# Patient Record
Sex: Male | Born: 2000
Health system: Southern US, Community
[De-identification: ages and names within clinical notes are randomized; demographics above are authoritative.]

## PROBLEM LIST (undated history)

## (undated) DIAGNOSIS — F909 Attention-deficit hyperactivity disorder, unspecified type: Secondary | ICD-10-CM

## (undated) HISTORY — DX: Attention-deficit hyperactivity disorder, unspecified type: F90.9

---
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
## 2019-12-11 ENCOUNTER — Other Ambulatory Visit: Payer: Self-pay | Admitting: Sports Medicine

## 2019-12-11 ENCOUNTER — Other Ambulatory Visit: Payer: Self-pay

## 2019-12-11 ENCOUNTER — Encounter: Payer: Self-pay | Admitting: Sports Medicine

## 2019-12-11 ENCOUNTER — Ambulatory Visit (INDEPENDENT_AMBULATORY_CARE_PROVIDER_SITE_OTHER): Payer: No Typology Code available for payment source | Admitting: Sports Medicine

## 2019-12-11 DIAGNOSIS — F32A Depression, unspecified: Secondary | ICD-10-CM

## 2019-12-11 DIAGNOSIS — F419 Anxiety disorder, unspecified: Secondary | ICD-10-CM | POA: Diagnosis not present

## 2019-12-11 MED ORDER — ESCITALOPRAM OXALATE 10 MG PO TABS: 10.0000 mg | ORAL_TABLET | Freq: Every day | ORAL | 3 refills | Status: DC

## 2019-12-11 MED FILL — ESCITALOPRAM 10 MG TABLET: 10 | 30 days supply | Qty: 30 | Fill #0

## 2019-12-11 NOTE — Progress Notes (Signed)
    Procedures performed today:    None.  Independent interpretation of notes and tests performed by another provider:   None.  Brief History, Exam, Impression, and Recommendations:    Anxiety and depression 19 year old male, historically approximately 2 years ago we treated him for anxiety and depression with Cymbalta, he really did not follow-up after this. Unfortunately he has noted worsening symptoms including severe agoraphobia. Difficulty concentrating, and concerns about attention deficit disorder. Today we discussed how difficulty concentrating can actually be related to anxiety and depression, we discussed the monoamine hypothesis of how SSRIs work. He does have a friend taking Lexapro and is agreeable to try this. I would also like to add some behavioral therapy for He does understand that the medication can take 4 to 6 weeks to start working. Starting Lexapro 10, and behavioral therapy, return to see me in 6 weeks, repeat PHQ at that time. No suicidal/homicidal ideation.    ___________________________________________ Ihor Austin. Benjamin Stain, M.D., ABFM., CAQSM. Primary Care and Sports Medicine [facility]  Adjunct Instructor of Family Medicine  [employer]

## 2019-12-11 NOTE — Assessment & Plan Note (Signed)
19 year old male, historically approximately 2 years ago we treated him for anxiety and depression with Cymbalta, he really did not follow-up after this. Unfortunately he has noted worsening symptoms including severe agoraphobia. Difficulty concentrating, and concerns about attention deficit disorder. Today we discussed how difficulty concentrating can actually be related to anxiety and depression, we discussed the monoamine hypothesis of how SSRIs work. He does have a friend taking Lexapro and is agreeable to try this. I would also like to add some behavioral therapy for He does understand that the medication can take 4 to 6 weeks to start working. Starting Lexapro 10, and behavioral therapy, return to see me in 6 weeks, repeat PHQ at that time. No suicidal/homicidal ideation.

## 2020-01-12 MED FILL — ESCITALOPRAM 10 MG TABLET: 10 | 30 days supply | Qty: 30 | Fill #1

## 2020-01-22 ENCOUNTER — Telehealth: Payer: No Typology Code available for payment source | Admitting: Sports Medicine

## 2020-02-11 MED FILL — ESCITALOPRAM 10 MG TABLET: 10 | 30 days supply | Qty: 30 | Fill #2

## 2020-03-29 MED FILL — ESCITALOPRAM 10 MG TABLET: 10 | 30 days supply | Qty: 30 | Fill #3

## 2020-05-14 ENCOUNTER — Other Ambulatory Visit: Payer: Self-pay | Admitting: Sports Medicine

## 2020-05-14 DIAGNOSIS — F419 Anxiety disorder, unspecified: Secondary | ICD-10-CM

## 2020-05-14 MED FILL — ESCITALOPRAM 10 MG TABLET: 10 | 30 days supply | Qty: 30 | Fill #0

## 2020-06-11 ENCOUNTER — Other Ambulatory Visit (HOSPITAL_BASED_OUTPATIENT_CLINIC_OR_DEPARTMENT_OTHER): Payer: Self-pay

## 2020-06-11 MED FILL — Escitalopram Oxalate Tab 10 MG (Base Equiv): ORAL | 30 days supply | Qty: 30 | Fill #0 | Status: AC

## 2021-05-20 ENCOUNTER — Other Ambulatory Visit (HOSPITAL_COMMUNITY): Payer: Self-pay

## 2022-02-20 ENCOUNTER — Other Ambulatory Visit (HOSPITAL_BASED_OUTPATIENT_CLINIC_OR_DEPARTMENT_OTHER): Payer: Self-pay

## 2022-10-17 DIAGNOSIS — H5213 Myopia, bilateral: Secondary | ICD-10-CM | POA: Diagnosis not present

## 2023-02-23 ENCOUNTER — Encounter: Payer: Self-pay | Admitting: Sports Medicine

## 2023-02-23 ENCOUNTER — Ambulatory Visit (INDEPENDENT_AMBULATORY_CARE_PROVIDER_SITE_OTHER): Payer: Commercial Managed Care - PPO | Admitting: Sports Medicine

## 2023-02-23 VITALS — BP 147/82 | Ht 68.0 in | Wt 201.0 lb

## 2023-02-23 DIAGNOSIS — F32A Depression, unspecified: Secondary | ICD-10-CM

## 2023-02-23 DIAGNOSIS — Z23 Encounter for immunization: Secondary | ICD-10-CM | POA: Diagnosis not present

## 2023-02-23 DIAGNOSIS — Z0000 Encounter for general adult medical examination without abnormal findings: Secondary | ICD-10-CM

## 2023-02-23 DIAGNOSIS — F419 Anxiety disorder, unspecified: Secondary | ICD-10-CM

## 2023-02-23 MED ORDER — ESCITALOPRAM OXALATE 10 MG PO TABS: ORAL_TABLET | ORAL | 3 refills | Status: DC

## 2023-02-23 NOTE — Assessment & Plan Note (Signed)
 Flu and Tdap today.

## 2023-02-23 NOTE — Addendum Note (Signed)
 Addended by: Carren Rang A on: 02/23/2023 02:52 PM   Modules accepted: Orders

## 2023-02-23 NOTE — Progress Notes (Signed)
    Procedures performed today:    None.  Independent interpretation of notes and tests performed by another provider:   None.  Brief History, Exam, Impression, and Recommendations:    Anxiety and depression This is a very pleasant 23 year old male, I last saw him about 3 to 4 years ago, he was treated with anxiety and depression with Cymbalta  but really did not follow up after that. Ultimately we ended up switching him to Lexapro  and this seems to work extremely well, we will restart Lexapro , 10 mg. I would like to see him back in about 6 weeks. He does have some concern that he may be on the autism spectrum, if unable to get full efficacy with Lexapro  we can consider the addition of ASD specific behavioral therapy.  Annual physical exam Flu and Tdap today.    ____________________________________________ Debby PARAS. Curtis, M.D., ABFM., CAQSM., AME. Primary Care and Sports Medicine [facility]  Adjunct Professor of [facility] Medicine  [employer]  Restaurant Manager, [employer]

## 2023-02-23 NOTE — Assessment & Plan Note (Signed)
 This is a very pleasant 23 year old male, I last saw him about 3 to 4 years ago, he was treated with anxiety and depression with Cymbalta  but really did not follow up after that. Ultimately we ended up switching him to Lexapro  and this seems to work extremely well, we will restart Lexapro , 10 mg. I would like to see him back in about 6 weeks. He does have some concern that he may be on the autism spectrum, if unable to get full efficacy with Lexapro  we can consider the addition of ASD specific behavioral therapy.

## 2023-04-06 ENCOUNTER — Ambulatory Visit: Payer: Commercial Managed Care - PPO | Admitting: Sports Medicine

## 2023-04-06 DIAGNOSIS — L03031 Cellulitis of right toe: Secondary | ICD-10-CM | POA: Diagnosis not present

## 2023-04-06 DIAGNOSIS — F419 Anxiety disorder, unspecified: Secondary | ICD-10-CM | POA: Diagnosis not present

## 2023-04-06 DIAGNOSIS — F32A Depression, unspecified: Secondary | ICD-10-CM

## 2023-04-06 MED ORDER — ESCITALOPRAM OXALATE 10 MG PO TABS: 10.0000 mg | ORAL_TABLET | Freq: Every day | ORAL | 3 refills | Status: DC

## 2023-04-06 MED ORDER — DOXYCYCLINE HYCLATE 100 MG PO TABS: 100.0000 mg | ORAL_TABLET | Freq: Two times a day (BID) | ORAL | 0 refills | Status: AC

## 2023-04-06 NOTE — Progress Notes (Signed)
    Procedures performed today:    None.  Independent interpretation of notes and tests performed by another provider:   None.  Brief History, Exam, Impression, and Recommendations:    Paronychia of great toe, right Pain and swelling right great toe medial and lateral. On exam it does look like medial lateral paronychia with a bit of hyponychia as well. We will add doxycycline for 7 days, I like to see him back after about 2 weeks for medial and lateral nail plate excision with phenol matricectomy.  Anxiety and depression Dramatic improvements with Lexapro, we will continue this.    ____________________________________________ Ihor Austin. Benjamin Stain, M.D., ABFM., CAQSM., AME. Primary Care and Sports Medicine [place] [facility]  Adjunct Professor of Family Medicine  [employer] [employer]  Restaurant manager, fast food

## 2023-04-06 NOTE — Assessment & Plan Note (Signed)
 Dramatic improvements with Lexapro, we will continue this.

## 2023-04-06 NOTE — Assessment & Plan Note (Signed)
 Pain and swelling right great toe medial and lateral. On exam it does look like medial lateral paronychia with a bit of hyponychia as well. We will add doxycycline for 7 days, I like to see him back after about 2 weeks for medial and lateral nail plate excision with phenol matricectomy.

## 2023-04-20 ENCOUNTER — Ambulatory Visit: Payer: Commercial Managed Care - PPO | Admitting: Sports Medicine

## 2023-04-20 ENCOUNTER — Other Ambulatory Visit (HOSPITAL_BASED_OUTPATIENT_CLINIC_OR_DEPARTMENT_OTHER): Payer: Self-pay

## 2023-04-20 ENCOUNTER — Encounter: Payer: Self-pay | Admitting: Sports Medicine

## 2023-04-20 DIAGNOSIS — L03031 Cellulitis of right toe: Secondary | ICD-10-CM

## 2023-04-20 MED ORDER — HYDROCODONE-ACETAMINOPHEN 10-325 MG PO TABS
1.0000 | ORAL_TABLET | Freq: Three times a day (TID) | ORAL | 0 refills | Status: DC | PRN
  Filled 2023-04-20: qty 15, 5d supply, fill #0

## 2023-04-20 NOTE — Progress Notes (Signed)
    Procedures performed today:    Procedure:  Removal of right great medial and lateral nail plate. Risks, benefits, alternatives explained to patient. Consent obtained. Time out conducted. Noted no overlying induration or erythema at site of injection. Toe cleaned with alcohol, then a total of 10cc lidocaine 1% infiltrated at adjacent webspaces at the location of the bifurcation of the common digital nerve to proper digital nerves.  Some lidocaine also infiltrated at hyponychium and under nail bed.  Adequate anesthesia ensured. Toe prepped and draped in a sterile fashion. Nail elevator used to separate nail plate from nail bed. Clippers used to cut toenail in a longitudinal fashion to proximal nail fold and matrix. Hemostat then used to separate nail fragment from surrounding structures. Nail bed and matrix treated. Minor bleeding controlled with pressure and phenol. Antibiotic ointment applied. Toe dressed. Advised to return if increased redness, swelling, drainage, fevers, or chills.  Independent interpretation of notes and tests performed by another provider:   None.  Brief History, Exam, Impression, and Recommendations:    Paronychia of great toe, right Pain right great toe medial and lateral consistent with medial and lateral paronychia with hyponychial, I saw him about 2 weeks ago, we did doxycycline for 7 days, it has improved considerably, today we performed medial and lateral nail plate excisions with phenol matricectomy, hydrocodone for postoperative pain, return to see me in about 2 weeks.    ____________________________________________ Ihor Austin. Benjamin Stain, M.D., ABFM., CAQSM., AME. Primary Care and Sports Medicine [facility] [facility]  Adjunct Professor of Family Medicine  [employer] [employer]  Restaurant manager, fast food

## 2023-04-20 NOTE — Assessment & Plan Note (Signed)
 Pain right great toe medial and lateral consistent with medial and lateral paronychia with hyponychial, I saw him about 2 weeks ago, we did doxycycline for 7 days, it has improved considerably, today we performed medial and lateral nail plate excisions with phenol matricectomy, hydrocodone for postoperative pain, return to see me in about 2 weeks.

## 2023-04-25 ENCOUNTER — Encounter: Payer: Self-pay | Admitting: Sports Medicine

## 2023-05-04 ENCOUNTER — Ambulatory Visit (INDEPENDENT_AMBULATORY_CARE_PROVIDER_SITE_OTHER): Admitting: Sports Medicine

## 2023-05-04 ENCOUNTER — Encounter: Payer: Self-pay | Admitting: Sports Medicine

## 2023-05-04 DIAGNOSIS — L03031 Cellulitis of right toe: Secondary | ICD-10-CM

## 2023-05-04 NOTE — Assessment & Plan Note (Signed)
 This is a very pleasant 23 year old male, we last saw him with a medial and lateral paronychia, we did small antibiotics, at the last visit on the seventh of this month we performed a medial and lateral nail plate excision with phenol matricectomy, he did well until he had an injury at work that resulted in a nail plate avulsion. It bled for a while, eventually stopped, today the nailbed looks good, there is no sign of infection. The remaining nail plate will likely grow back from the portion of the nail matrix that was not treated with phenol. I added some antibiotic ointment, he just needs a Band-Aid, he is to protect this while at work and he can return to see me as needed.

## 2023-05-04 NOTE — Progress Notes (Signed)
    Procedures performed today:    None.  Independent interpretation of notes and tests performed by another provider:   None.  Brief History, Exam, Impression, and Recommendations:    Paronychia of great toe, right This is a very pleasant 23 year old male, we last saw him with a medial and lateral paronychia, we did small antibiotics, at the last visit on the seventh of this month we performed a medial and lateral nail plate excision with phenol matricectomy, he did well until he had an injury at work that resulted in a nail plate avulsion. It bled for a while, eventually stopped, today the nailbed looks good, there is no sign of infection. The remaining nail plate will likely grow back from the portion of the nail matrix that was not treated with phenol. I added some antibiotic ointment, he just needs a Band-Aid, he is to protect this while at work and he can return to see me as needed.    ____________________________________________ Ihor Austin. Benjamin Stain, M.D., ABFM., CAQSM., AME. [facility]  Adjunct Professor of Family Medicine  [employer] [employer]  Restaurant manager, [employer]

## 2023-10-16 ENCOUNTER — Encounter: Payer: Self-pay | Admitting: Sports Medicine

## 2023-12-21 ENCOUNTER — Ambulatory Visit

## 2023-12-21 ENCOUNTER — Ambulatory Visit
Admission: EM | Admit: 2023-12-21 | Discharge: 2023-12-21 | Disposition: A | Discharge status: Home / Self Care | Attending: Internal Medicine | Admitting: Internal Medicine

## 2023-12-21 ENCOUNTER — Ambulatory Visit: Payer: Self-pay | Admitting: Internal Medicine

## 2023-12-21 DIAGNOSIS — R1084 Generalized abdominal pain: Secondary | ICD-10-CM | POA: Diagnosis not present

## 2023-12-21 DIAGNOSIS — R109 Unspecified abdominal pain: Secondary | ICD-10-CM | POA: Diagnosis not present

## 2023-12-21 DIAGNOSIS — K5900 Constipation, unspecified: Secondary | ICD-10-CM

## 2023-12-21 NOTE — ED Triage Notes (Signed)
 Pt c/o abd pain about 10-14 days. Last BM this morning, but was first on in several days. Hx of constipation since childhood. OTC laxatives and suppository tried.

## 2023-12-21 NOTE — Discharge Instructions (Signed)
 I will call if x-ray is abnormal.  Start MiraLAX.  Ensure adequate fluids and high fiber diet. See attached for high-fiber diet.

## 2023-12-21 NOTE — ED Provider Notes (Addendum)
 [facility]    CSN: [id number] Arrival date & time: 12/21/23  1731      History   Chief Complaint Chief Complaint  Patient presents with   Abdominal Pain   Constipation    HPI Darrell Mcbride is a 23 y.o. male.   Patient presents with intermittent, generalized abdominal pain that has been present for about 1.5 weeks.  He has concern for constipation given this has been an intermittent problem since childhood.  His last bowel movement was this morning but prior to that his last BM was about 3 days ago.  He typically has 2-3 bowel movements per day.  He has not taken any medication for symptoms.  Denies nausea or vomiting.  Denies blood in stool.  Denies fever.  He is urinating normally.   Abdominal Pain Constipation   Past Medical History:  Diagnosis Date   ADHD (attention deficit hyperactivity disorder)     Patient Active Problem List   Diagnosis Date Noted   Paronychia of great toe, right 04/06/2023   Annual physical exam 12/11/2017   Irritable bowel syndrome with constipation 12/11/2017   Anxiety and depression 12/11/2017   Attention deficit disorder 12/11/2017   Acne vulgaris 12/11/2017    History reviewed. No pertinent surgical history.     Home Medications    Prior to Admission medications   Medication Sig Start Date End Date Taking? Authorizing Provider  escitalopram  (LEXAPRO ) 10 MG tablet Take 1 tablet (10 mg total) by mouth daily. 04/06/23   Curtis Debby PARAS, MD  HYDROcodone -acetaminophen  (NORCO) 10-325 MG tablet Take 1 tablet by mouth every 8 (eight) hours as needed. 04/20/23   Curtis Debby PARAS, MD    Family History Family History  Problem Relation Age of Onset   Drug abuse Father    ADD / ADHD Brother     Social History Social History   Tobacco Use   Smoking status: Never   Smokeless tobacco: Never  Vaping Use   Vaping status: Never Used  Substance Use Topics   Alcohol use: No   Drug use: Yes    Frequency: 2.0  times per week    Types: Marijuana    Comment: last use 6 months      Allergies   Patient has no known allergies.   Review of Systems Review of Systems Per HPI  Physical Exam Triage Vital Signs ED Triage Vitals  Encounter Vitals Group     BP 12/21/23 1744 122/70     Girls Systolic BP Percentile --      Girls Diastolic BP Percentile --      Boys Systolic BP Percentile --      Boys Diastolic BP Percentile --      Pulse Rate 12/21/23 1744 71     Resp 12/21/23 1744 17     Temp 12/21/23 1744 98.8 F (37.1 C)     Temp Source 12/21/23 1744 Oral     SpO2 12/21/23 1744 97 %     Weight --      Height --      Head Circumference --      Peak Flow --      Pain Score 12/21/23 1747 6     Pain Loc --      Pain Education --      Exclude from Growth Chart --    No data found.  Updated Vital Signs BP 122/70 (BP Location: Right Arm)   Pulse 71   Temp 98.8 F (  37.1 C) (Oral)   Resp 17   SpO2 97%   Visual Acuity Right Eye Distance:   Left Eye Distance:   Bilateral Distance:    Right Eye Near:   Left Eye Near:    Bilateral Near:     Physical Exam Constitutional:      General: He is not in acute distress.    Appearance: Normal appearance. He is not toxic-appearing or diaphoretic.  HENT:     Head: Normocephalic and atraumatic.  Eyes:     Extraocular Movements: Extraocular movements intact.     Conjunctiva/sclera: Conjunctivae normal.  Pulmonary:     Effort: Pulmonary effort is normal.  Abdominal:     General: Bowel sounds are normal. There is no distension.     Palpations: Abdomen is soft.     Tenderness: There is abdominal tenderness.     Comments: Mild generalized tenderness to palpation.  Neurological:     General: No focal deficit present.     Mental Status: He is alert and oriented to person, place, and time. Mental status is at baseline.  Psychiatric:        Mood and Affect: Mood normal.        Behavior: Behavior normal.        Thought Content: Thought  content normal.        Judgment: Judgment normal.      UC Treatments / Results  Labs (all labs ordered are listed, but only abnormal results are displayed) Labs Reviewed - No data to display  EKG   Radiology DG Abd 2 Views Result Date: 12/21/2023 EXAM: 2 VIEW XRAY OF THE ABDOMEN 12/21/2023 06:27:34 PM COMPARISON: Comparison study 10/15/2017. CLINICAL HISTORY: abdominal pain,constipation FINDINGS: BOWEL: Scattered large and small bowel gas is noted. No obstructive changes are seen. No free air is noted. No abnormal mass is seen. SOFT TISSUES: Some suggestion of suprarenal calcifications is noted bilaterally which may be adrenal in nature. No renal or ureteral calculi are seen. BONES: No acute osseous abnormality. IMPRESSION: 1. No bowel obstruction. Electronically signed by: Oneil Devonshire MD 12/21/2023 07:07 PM EST RP Workstation: [id number]    Procedures Procedures (including critical care time)  Medications Ordered in UC Medications - No data to display  Initial Impression / Assessment and Plan / UC Course  I have reviewed the triage vital signs and the nursing notes.  Pertinent labs & imaging results that were available during my care of the patient were reviewed by me and considered in my medical decision making (see chart for details).     There are no signs of acute abdomen or dehydration on exam that would warrant more advanced imaging or ER evaluation.  X-ray of the abdomen was completed that showed some bowel gas but no signs of bowel obstruction which is reassuring.  I do suspect possible constipation as well.  Recommended MiraLAX, increasing fluids, high-fiber diet. Patient states that he has Miralax at home. Patient may also benefit from gas relief medications so educated patient on this.  Patient was given strict return and ER precautions.  Patient verbalized understanding and was agreeable with plan. Final Clinical Impressions(s) / UC Diagnoses   Final diagnoses:   Generalized abdominal pain     Discharge Instructions      I will call if x-ray is abnormal.  Start MiraLAX.  Ensure adequate fluids and high fiber diet. See attached for high-fiber diet.     ED Prescriptions   None    PDMP not reviewed this  encounter.   Hazen Darryle BRAVO, [place] 12/21/23 1923    Hazen Darryle BRAVO, [place] 12/21/23 [phone]

## 2024-02-19 ENCOUNTER — Encounter: Payer: Self-pay | Admitting: Emergency Medicine

## 2024-02-19 ENCOUNTER — Ambulatory Visit
Admission: EM | Admit: 2024-02-19 | Discharge: 2024-02-19 | Disposition: A | Discharge status: Home / Self Care | Attending: Family Medicine | Admitting: Family Medicine

## 2024-02-19 DIAGNOSIS — J3489 Other specified disorders of nose and nasal sinuses: Secondary | ICD-10-CM | POA: Diagnosis not present

## 2024-02-19 MED ORDER — PREDNISONE 20 MG PO TABS: 40.0000 mg | ORAL_TABLET | Freq: Every day | ORAL | 0 refills | Status: AC

## 2024-02-19 NOTE — ED Provider Notes (Signed)
 " [facility]    CSN: [id number] Arrival date & time: 02/19/24  1811      History   Chief Complaint Chief Complaint  Patient presents with   Nasal Congestion    HPI Darrell Mcbride is a 24 y.o. male.   Has been feeling sick for the last 4 days.  He has not work since Monday.  He got sent home from work because he was having a postnasal drip, mucus, feeling of tightness in his throat.  Feeling like he was having trouble swallowing.  No fever or chills.  No headache or bodyaches.  No coughing or chest congestion until later now has a mild cough.  Mild pressure and pain in his sinuses.  Otherwise healthy.    Past Medical History:  Diagnosis Date   ADHD (attention deficit hyperactivity disorder)     Patient Active Problem List   Diagnosis Date Noted   Paronychia of great toe, right 04/06/2023   Annual physical exam 12/11/2017   Irritable bowel syndrome with constipation 12/11/2017   Anxiety and depression 12/11/2017   Attention deficit disorder 12/11/2017   Acne vulgaris 12/11/2017    History reviewed. No pertinent surgical history.     Home Medications    Prior to Admission medications  Medication Sig Start Date End Date Taking? Authorizing Provider  predniSONE  (DELTASONE ) 20 MG tablet Take 2 tablets (40 mg total) by mouth daily with breakfast. 02/19/24  Yes Maranda Jamee Jacob, MD    Family History Family History  Problem Relation Age of Onset   Drug abuse Father    ADD / ADHD Brother     Social History Social History[1]   Allergies   Patient has no known allergies.   Review of Systems Review of Systems See HPI  Physical Exam Triage Vital Signs ED Triage Vitals  Encounter Vitals Group     BP 02/19/24 1854 (!) 142/87     Girls Systolic BP Percentile --      Girls Diastolic BP Percentile --      Boys Systolic BP Percentile --      Boys Diastolic BP Percentile --      Pulse Rate 02/19/24 1854 88     Resp --      Temp 02/19/24 1854 98.9  F (37.2 C)     Temp Source 02/19/24 1854 Oral     SpO2 02/19/24 1854 98 %     Weight --      Height --      Head Circumference --      Peak Flow --      Pain Score 02/19/24 1855 0     Pain Loc --      Pain Education --      Exclude from Growth Chart --    No data found.  Updated Vital Signs BP (!) 142/87 (BP Location: Right Arm)   Pulse 88   Temp 98.9 F (37.2 C) (Oral)   SpO2 98%      Bilateral Near:  Physical Exam Constitutional:      General: He is not in acute distress.    Appearance: He is well-developed and normal weight.  HENT:     Head: Normocephalic and atraumatic.     Right Ear: Tympanic membrane normal.     Left Ear: Tympanic membrane normal.     Nose: Congestion present.     Mouth/Throat:     Pharynx: No posterior oropharyngeal erythema.  Eyes:     Conjunctiva/sclera:  Conjunctivae normal.     Pupils: Pupils are equal, round, and reactive to light.  Cardiovascular:     Rate and Rhythm: Normal rate and regular rhythm.     Heart sounds: Normal heart sounds.  Pulmonary:     Effort: Pulmonary effort is normal. No respiratory distress.     Breath sounds: Normal breath sounds.  Musculoskeletal:        General: Normal range of motion.     Cervical back: Normal range of motion.  Lymphadenopathy:     Cervical: No cervical adenopathy.  Skin:    General: Skin is warm and dry.  Neurological:     Mental Status: He is alert.      UC Treatments / Results  Labs (all labs ordered are listed, but only abnormal results are displayed) Labs Reviewed - No data to display  EKG   Radiology No results found.  Procedures Procedures (including critical care time)  Medications Ordered in UC Medications - No data to display  Initial Impression / Assessment and Plan / UC Course  I have reviewed the triage vital signs and the nursing notes.  Pertinent labs & imaging results that were available during my care of the patient were reviewed by me and considered  in my medical decision making (see chart for details).    Discussed likely viral illness.  Antibiotics not indicated Final Clinical Impressions(s) / UC Diagnoses   Final diagnoses:  Sinus drainage     Discharge Instructions      Drink lots of water Take the prednisone  once a day  See your doctor if not improving by next week   ED Prescriptions     Medication Sig Dispense Auth. Provider   predniSONE  (DELTASONE ) 20 MG tablet Take 2 tablets (40 mg total) by mouth daily with breakfast. 10 tablet Maranda Jamee Jacob, MD      PDMP not reviewed this encounter.    [1]  Social History Tobacco Use   Smoking status: Never   Smokeless tobacco: Never  Vaping Use   Vaping status: Never Used  Substance Use Topics   Alcohol use: No   Drug use: Yes    Frequency: 2.0 times per week    Types: Marijuana    Comment: last use 6 months      Maranda Jamee Jacob, MD 02/19/24 1918  "

## 2024-02-19 NOTE — Discharge Instructions (Signed)
 Drink lots of water Take the prednisone  once a day  See your doctor if not improving by next week

## 2024-02-19 NOTE — ED Triage Notes (Signed)
 Pt c/o sinus pressure and congestion for the last 3 days. He has been taking mucinex with some relief but still having headaches/pressure.
